# Patient Record
Sex: Male | Born: 1965 | Race: White | Hispanic: No | Smoking: Never smoker
Health system: Southern US, Community
[De-identification: ages and names within clinical notes are randomized; demographics above are authoritative.]

## PROBLEM LIST (undated history)

## (undated) DIAGNOSIS — I10 Essential (primary) hypertension: Secondary | ICD-10-CM

## (undated) DIAGNOSIS — M542 Cervicalgia: Secondary | ICD-10-CM

## (undated) HISTORY — PX: NECK SURGERY: SHX720

## (undated) HISTORY — PX: TONSILLECTOMY: SUR1361

---

## 2007-07-26 ENCOUNTER — Emergency Department (HOSPITAL_COMMUNITY): Admission: EM | Admit: 2007-07-26 | Discharge: 2007-07-26 | Payer: Self-pay | Admitting: Emergency Medicine

## 2009-05-20 ENCOUNTER — Inpatient Hospital Stay (HOSPITAL_COMMUNITY): Admission: RE | Admit: 2009-05-20 | Discharge: 2009-05-21 | Payer: Self-pay | Admitting: Neurosurgery

## 2009-12-29 ENCOUNTER — Encounter
Admission: RE | Admit: 2009-12-29 | Discharge: 2009-12-29 | Payer: Self-pay | Source: Home / Self Care | Attending: Neurosurgery | Admitting: Neurosurgery

## 2010-04-06 LAB — COMPREHENSIVE METABOLIC PANEL
Albumin: 4 g/dL (ref 3.5–5.2)
BUN: 13 mg/dL (ref 6–23)
Calcium: 9.5 mg/dL (ref 8.4–10.5)
Creatinine, Ser: 0.85 mg/dL (ref 0.4–1.5)
Glucose, Bld: 99 mg/dL (ref 70–99)
Total Protein: 6.6 g/dL (ref 6.0–8.3)

## 2010-04-06 LAB — DIFFERENTIAL
Basophils Relative: 1 % (ref 0–1)
Lymphocytes Relative: 21 % (ref 12–46)
Lymphs Abs: 1.4 10*3/uL (ref 0.7–4.0)
Monocytes Relative: 7 % (ref 3–12)
Neutro Abs: 4.6 10*3/uL (ref 1.7–7.7)
Neutrophils Relative %: 67 % (ref 43–77)

## 2010-04-06 LAB — PROTIME-INR: INR: 0.98 (ref 0.00–1.49)

## 2010-04-06 LAB — URINALYSIS, ROUTINE W REFLEX MICROSCOPIC
Bilirubin Urine: NEGATIVE
Nitrite: NEGATIVE
Specific Gravity, Urine: 1.008 (ref 1.005–1.030)
Urobilinogen, UA: 0.2 mg/dL (ref 0.0–1.0)

## 2010-04-06 LAB — SURGICAL PCR SCREEN: MRSA, PCR: NEGATIVE

## 2010-04-06 LAB — CBC
HCT: 44.3 % (ref 39.0–52.0)
Hemoglobin: 15.2 g/dL (ref 13.0–17.0)
MCHC: 34.3 g/dL (ref 30.0–36.0)
Platelets: 278 10*3/uL (ref 150–400)
RDW: 13.5 % (ref 11.5–15.5)

## 2010-04-06 LAB — APTT: aPTT: 34 seconds (ref 24–37)

## 2010-10-14 LAB — D-DIMER, QUANTITATIVE: D-Dimer, Quant: 0.25

## 2010-10-14 LAB — POCT CARDIAC MARKERS
CKMB, poc: <1 — ABNORMAL LOW
Troponin i, poc: <0.05

## 2010-10-14 LAB — POCT I-STAT, CHEM 8
BUN: 12
Calcium, Ion: 1.1 — ABNORMAL LOW
Chloride: 108
Glucose, Bld: 101 — ABNORMAL HIGH

## 2010-10-14 LAB — CBC
HCT: 39.1
Platelets: 274
RDW: 13.4

## 2010-10-14 LAB — DIFFERENTIAL
Basophils Absolute: 0.1
Eosinophils Absolute: 0
Eosinophils Relative: 1
Lymphocytes Relative: 5 — ABNORMAL LOW

## 2015-07-16 ENCOUNTER — Other Ambulatory Visit: Payer: Self-pay | Admitting: Neurosurgery

## 2015-07-16 DIAGNOSIS — M5020 Other cervical disc displacement, unspecified cervical region: Secondary | ICD-10-CM

## 2015-07-27 ENCOUNTER — Ambulatory Visit
Admission: RE | Admit: 2015-07-27 | Discharge: 2015-07-27 | Disposition: A | Discharge status: Home / Self Care | Payer: Worker's Compensation | Source: Ambulatory Visit | Attending: Neurosurgery | Admitting: Neurosurgery

## 2015-07-27 DIAGNOSIS — M5020 Other cervical disc displacement, unspecified cervical region: Secondary | ICD-10-CM

## 2015-07-27 MED ORDER — IOPAMIDOL (ISOVUE-M 300) INJECTION 61%
1.0000 mL | Freq: Once | INTRAMUSCULAR | Status: AC | PRN
  Administered 2015-07-27: 1 mL via EPIDURAL

## 2015-07-27 MED ORDER — TRIAMCINOLONE ACETONIDE 40 MG/ML IJ SUSP (RADIOLOGY)
60.0000 mg | Freq: Once | INTRAMUSCULAR | Status: AC
  Administered 2015-07-27: 60 mg via EPIDURAL

## 2015-07-27 NOTE — Discharge Instructions (Signed)

## 2017-06-26 ENCOUNTER — Emergency Department (HOSPITAL_BASED_OUTPATIENT_CLINIC_OR_DEPARTMENT_OTHER)
Admission: EM | Admit: 2017-06-26 | Discharge: 2017-06-26 | Disposition: A | Discharge status: Home / Self Care | Payer: Medicaid Other | Attending: Emergency Medicine | Admitting: Emergency Medicine

## 2017-06-26 ENCOUNTER — Emergency Department (HOSPITAL_BASED_OUTPATIENT_CLINIC_OR_DEPARTMENT_OTHER): Payer: Medicaid Other

## 2017-06-26 ENCOUNTER — Encounter (HOSPITAL_BASED_OUTPATIENT_CLINIC_OR_DEPARTMENT_OTHER): Payer: Self-pay

## 2017-06-26 ENCOUNTER — Other Ambulatory Visit: Payer: Self-pay

## 2017-06-26 DIAGNOSIS — I10 Essential (primary) hypertension: Secondary | ICD-10-CM | POA: Insufficient documentation

## 2017-06-26 DIAGNOSIS — R079 Chest pain, unspecified: Secondary | ICD-10-CM

## 2017-06-26 DIAGNOSIS — R0789 Other chest pain: Secondary | ICD-10-CM | POA: Insufficient documentation

## 2017-06-26 HISTORY — DX: Essential (primary) hypertension: I10

## 2017-06-26 HISTORY — DX: Cervicalgia: M54.2

## 2017-06-26 LAB — CBC
HEMATOCRIT: 41.3 % (ref 39.0–52.0)
HEMOGLOBIN: 14.3 g/dL (ref 13.0–17.0)
MCH: 31 pg (ref 26.0–34.0)
MCHC: 34.6 g/dL (ref 30.0–36.0)
MCV: 89.4 fL (ref 78.0–100.0)
Platelets: 329 10*3/uL (ref 150–400)
RBC: 4.62 MIL/uL (ref 4.22–5.81)
RDW: 12.5 % (ref 11.5–15.5)
WBC: 8.8 10*3/uL (ref 4.0–10.5)

## 2017-06-26 LAB — HEPATIC FUNCTION PANEL
ALBUMIN: 3.6 g/dL (ref 3.5–5.0)
ALK PHOS: 69 U/L (ref 38–126)
ALT: 22 U/L (ref 17–63)
AST: 23 U/L (ref 15–41)
BILIRUBIN DIRECT: 0.1 mg/dL (ref 0.1–0.5)
BILIRUBIN TOTAL: 0.4 mg/dL (ref 0.3–1.2)
Indirect Bilirubin: 0.3 mg/dL (ref 0.3–0.9)
Total Protein: 7 g/dL (ref 6.5–8.1)

## 2017-06-26 LAB — BASIC METABOLIC PANEL
ANION GAP: 13 (ref 5–15)
BUN: 19 mg/dL (ref 6–20)
CALCIUM: 8.6 mg/dL — AB (ref 8.9–10.3)
CO2: 25 mmol/L (ref 22–32)
Chloride: 100 mmol/L — ABNORMAL LOW (ref 101–111)
Creatinine, Ser: 0.85 mg/dL (ref 0.61–1.24)
GFR calc Af Amer: >60 mL/min (ref >60)
GFR calc non Af Amer: >60 mL/min (ref >60)
GLUCOSE: 108 mg/dL — AB (ref 65–99)
Potassium: 3.3 mmol/L — ABNORMAL LOW (ref 3.5–5.1)
Sodium: 138 mmol/L (ref 135–145)

## 2017-06-26 LAB — TROPONIN I: Troponin I: <0.03 ng/mL (ref <0.03)

## 2017-06-26 LAB — D-DIMER, QUANTITATIVE: D-Dimer, Quant: <0.27 ug/mL-FEU (ref 0.00–0.50)

## 2017-06-26 LAB — LIPASE, BLOOD: Lipase: 37 U/L (ref 11–51)

## 2017-06-26 NOTE — ED Notes (Signed)
ED Provider at bedside. 

## 2017-06-26 NOTE — ED Triage Notes (Signed)
C/o CP x 30 min-NAD-steady gait 

## 2017-06-26 NOTE — Discharge Instructions (Signed)
You were seen in the emergency department for chest pain. Your chest xray was normal. Your ekg and blood work did not show signs of an acute heart attack.   We are unsure of the exact cause of your symptoms, for this reason we would like you to follow up closely with your primary care provider- please call the office tomorrow to make an appointment in 1-3 days for re-evaluation. Return to the ER at any time for new or worsening symptoms including but not limited to worsened pain, trouble breathing, vomiting, fevers, passing out or any other concerns.

## 2017-06-26 NOTE — ED Notes (Signed)
Pt walked to BR with steady gate. Appears to be in NAD>

## 2017-06-26 NOTE — ED Provider Notes (Signed)
[facility] EMERGENCY DEPARTMENT Provider Note   CSN: [id number] Arrival date & time: 06/26/17  1322     History   Chief Complaint Chief Complaint  Patient presents with  . Chest Pain    HPI Terrance Burns is a 52 y.o. male with a hx of HTN who presents to the ED with complaints of chest pain that started approximately 90 minutes ago. He was at the grocery store when pain started. Describes it as left lower chests somewhat radiating to back, somewhat sharp in nature, worse with a deep breath. No other specific alleviating/aggravating factors. Rates it a 5/10 in severity at present. States associated feeling of heart beating quickly. He had hx of similar pain 2 months ago that was self limiting, did not seek evaluation for this and this is the first time it has reoccurred. Denies nausea, vomiting, diarrhea, blood in stool, dyspnea, dizziness, numbness, weakness, or syncope. He states that he drinks coffee in the morning sometimes, had 1 cup this AM, this is not atypical for him. He reports drinking 3 beers per day, last drank last evening, has not had any increase in EtOH recently. Denies drug use. Denies increase in NSAIDs.    HPI  Past Medical History:  Diagnosis Date  . Hypertension   . Neck pain     There are no active problems to display for this patient.   Past Surgical History:  Procedure Laterality Date  . NECK SURGERY    . TONSILLECTOMY          Home Medications    Prior to Admission medications   Not on File    Family History No family history on file.  Social History Social History   Tobacco Use  . Smoking status: Never Smoker  . Smokeless tobacco: Never Used  Substance Use Topics  . Alcohol use: Yes    Comment: daily  . Drug use: Never     Allergies   Codeine; Meperidine; Pseudoephedrine; and Penicillins   Review of Systems Review of Systems  Constitutional: Negative for chills and fever.  Respiratory: Negative for shortness of  breath.   Cardiovascular: Positive for chest pain and palpitations. Negative for leg swelling.  Gastrointestinal: Negative for abdominal pain, blood in stool, constipation, diarrhea, nausea and vomiting.  Neurological: Negative for dizziness, syncope, weakness and numbness.  All other systems reviewed and are negative.   Physical Exam Updated Vital Signs BP (!) 168/108 (BP Location: Left Arm)   Pulse (!) 109   Temp 98.1 F (36.7 C) (Oral)   Resp 18   Ht 5\' 6"  (1.676 m)   Wt 80.3 kg (177 lb)   SpO2 100%   BMI 28.57 kg/m   Physical Exam  Constitutional: He appears well-developed and well-nourished.  Non-toxic appearance. He does not have a sickly appearance. No distress.  HENT:  Head: Normocephalic and atraumatic.  Eyes: Conjunctivae are normal. Right eye exhibits no discharge. Left eye exhibits no discharge.  Neck: Normal range of motion. Neck supple. Carotid bruit is not present.  Cardiovascular: Regular rhythm. Tachycardia present.  No murmur heard. Pulses:      Radial pulses are 2+ on the right side, and 2+ on the left side.       Dorsalis pedis pulses are 2+ on the right side, and 2+ on the left side.  Pulmonary/Chest: Breath sounds normal. No respiratory distress. He has no wheezes. He has no rales.  Abdominal: Soft. He exhibits no distension. There is tenderness (mild epigastric). There  is no rigidity, no rebound, no guarding, no CVA tenderness and negative Murphy's sign.  Musculoskeletal:       Right lower leg: He exhibits no tenderness and no edema.       Left lower leg: He exhibits no tenderness and no edema.  Neurological: He is alert.  Clear speech.   Skin: Skin is warm and dry. No rash noted.  Psychiatric: He has a normal mood and affect. His behavior is normal.  Nursing note and vitals reviewed.   ED Treatments / Results  Labs Results for orders placed or performed during the hospital encounter of 06/26/17  Basic metabolic panel  Result Value Ref Range    Sodium 138 135 - 145 mmol/L   Potassium 3.3 (L) 3.5 - 5.1 mmol/L   Chloride 100 (L) 101 - 111 mmol/L   CO2 25 22 - 32 mmol/L   Glucose, Bld 108 (H) 65 - 99 mg/dL   BUN 19 6 - 20 mg/dL   Creatinine, Ser 4.54 0.61 - 1.24 mg/dL   Calcium 8.6 (L) 8.9 - 10.3 mg/dL   GFR calc non Af Amer >60 >60 mL/min   GFR calc Af Amer >60 >60 mL/min   Anion gap 13 5 - 15  CBC  Result Value Ref Range   WBC 8.8 4.0 - 10.5 K/uL   RBC 4.62 4.22 - 5.81 MIL/uL   Hemoglobin 14.3 13.0 - 17.0 g/dL   HCT 09.8 11.9 - 14.7 %   MCV 89.4 78.0 - 100.0 fL   MCH 31.0 26.0 - 34.0 pg   MCHC 34.6 30.0 - 36.0 g/dL   RDW 82.9 56.2 - 13.0 %   Platelets 329 150 - 400 K/uL  Troponin I  Result Value Ref Range   Troponin I <0.03 <0.03 ng/mL  D-dimer, quantitative (not at [facility])  Result Value Ref Range   D-Dimer, Quant <0.27 0.00 - 0.50 ug/mL-FEU  Lipase, blood  Result Value Ref Range   Lipase 37 11 - 51 U/L  Hepatic function panel  Result Value Ref Range   Total Protein 7.0 6.5 - 8.1 g/dL   Albumin 3.6 3.5 - 5.0 g/dL   AST 23 15 - 41 U/L   ALT 22 17 - 63 U/L   Alkaline Phosphatase 69 38 - 126 U/L   Total Bilirubin 0.4 0.3 - 1.2 mg/dL   Bilirubin, Direct 0.1 0.1 - 0.5 mg/dL   Indirect Bilirubin 0.3 0.3 - 0.9 mg/dL  Troponin I  Result Value Ref Range   Troponin I <0.03 <0.03 ng/mL   EKG EKG Interpretation  Date/Time:  Monday June 26 2017 13:28:46 EDT Ventricular Rate:  102 PR Interval:  152 QRS Duration: 110 QT Interval:  352 QTC Calculation: 458 R Axis:   54 Text Interpretation:  Sinus tachycardia Incomplete right bundle branch block Borderline ECG No significant change since last tracing Confirmed by Marily Memos [phone]) on 06/26/2017 1:57:07 PM   Radiology Dg Chest 2 View  Result Date: 06/26/2017 CLINICAL DATA:  52 year old male with a history of chest pain EXAM: CHEST - 2 VIEW COMPARISON:  05/14/2009 FINDINGS: Cardiomediastinal silhouette within normal limits in size and contour. No evidence of  central vascular congestion. No pneumothorax or pleural effusion. No confluent airspace disease. Similar appearance of rounded density at the peripheral of left lung compatible with granuloma. No acute displaced fracture. Surgical changes of the cervical region incompletely imaged. IMPRESSION: Negative for acute cardiopulmonary disease Electronically Signed   By: Gilmer Mor D.O.   On:  06/26/2017 13:44    Procedures Procedures (including critical care time)  Medications Ordered in ED Medications - No data to display   Initial Impression / Assessment and Plan / ED Course  I have reviewed the triage vital signs and the nursing notes.  Pertinent labs & imaging results that were available during my care of the patient were reviewed by me and considered in my medical decision making (see chart for details).   Patient presents with complaint of chest pain. Patient nontoxic appearing, in no apparent distress, vitals notable for hypertension and tachycardia initially. Patient's exam otherwise notable for very mild epigastric tenderness, no peritoneal signs. DDX: ACS, PE, dissection, gastritis/GERD, cholecystitis, pancreatitis. Will evaluate with labs, CXR, and EKG. Patient declining pain medication.   Labs fairly unremarkable. No leukocytosis. No anemia. Mild electrolyte abnormalities including hypokalemia at 3.3, hypochloremia at 100, and hypocalcemia at 8.6. Lipase, LFTs, and renal function WNL. Troponin negative x 2. D-dimer WNL. CXR negative for pneumonia, effusion, or pneumothorax.   Patient with low risk Heart Score, negative troponin x 2, EKG without obvious ischemia, doubt ACS. Patient low risk wells- ddimer negative, doubt pulmonary embolism. Patient's pain is not a tearing sensation, improved without intervention, 2+ symmetric pulses, no widening of the mediastinum on imagining, doubt dissection. Patient does not have a surgical abdomen, no peritoneal signs, in combination with labs doubt  pancreatitis or cholecystitis.   17:30: RE-EVAL: Patient feeling much better, states he is ready to go home. Vitals improved, remains somewhat hypertensive, aware of need for recheck.   Unclear definitive etiology to patient's symptoms. Will discharge home with recommendations for close PCP follow up and strict return precautions. I discussed results, treatment plan, need for PCP follow-up, and return precautions with the patient. Provided opportunity for questions, patient confirmed understanding and is in agreement with plan.   Findings and plan of care discussed with supervising physician Dr. Clayborne Dana who is in agreement with plan.   Final Clinical Impressions(s) / ED Diagnoses   Final diagnoses:  Chest pain, unspecified type    ED Discharge Orders    None       Cherly Anderson, PA-C 06/26/17 1931    Mesner, Barbara Cower, MD 06/27/17 [phone]

## 2017-06-28 ENCOUNTER — Other Ambulatory Visit: Payer: Self-pay | Admitting: Nurse Practitioner

## 2017-06-28 DIAGNOSIS — M5020 Other cervical disc displacement, unspecified cervical region: Secondary | ICD-10-CM

## 2017-06-29 ENCOUNTER — Ambulatory Visit
Admission: RE | Admit: 2017-06-29 | Discharge: 2017-06-29 | Disposition: A | Discharge status: Home / Self Care | Payer: Medicaid Other | Source: Ambulatory Visit | Attending: Nurse Practitioner | Admitting: Nurse Practitioner

## 2017-06-29 DIAGNOSIS — M5020 Other cervical disc displacement, unspecified cervical region: Secondary | ICD-10-CM

## 2018-09-27 ENCOUNTER — Other Ambulatory Visit: Payer: Self-pay | Admitting: Neurosurgery

## 2018-09-27 DIAGNOSIS — M47812 Spondylosis without myelopathy or radiculopathy, cervical region: Secondary | ICD-10-CM

## 2018-09-28 ENCOUNTER — Other Ambulatory Visit: Payer: Self-pay | Admitting: Neurosurgery

## 2018-09-28 ENCOUNTER — Telehealth: Payer: Self-pay

## 2018-09-28 DIAGNOSIS — M47812 Spondylosis without myelopathy or radiculopathy, cervical region: Secondary | ICD-10-CM

## 2018-09-28 NOTE — Telephone Encounter (Signed)
Phone call to patient to verify medication list and allergies for myelogram procedure. Pt instructed that his current medications were safe to continue to take. Advised to call [facility] imaging if his medications were to change. Pt also instructed to lay flat 24 hours post op and to have a driver the day of the procedure. Pt verbalized understanding.

## 2018-10-03 ENCOUNTER — Other Ambulatory Visit: Payer: Medicaid Other

## 2018-10-04 ENCOUNTER — Other Ambulatory Visit: Payer: Medicaid Other

## 2018-10-04 ENCOUNTER — Inpatient Hospital Stay: Admission: RE | Admit: 2018-10-04 | Payer: Medicaid Other | Source: Ambulatory Visit

## 2018-11-05 ENCOUNTER — Ambulatory Visit
Admission: RE | Admit: 2018-11-05 | Discharge: 2018-11-05 | Disposition: A | Discharge status: Home / Self Care | Payer: Medicaid Other | Source: Ambulatory Visit | Attending: Neurosurgery | Admitting: Neurosurgery

## 2018-11-05 ENCOUNTER — Other Ambulatory Visit: Payer: Self-pay

## 2018-11-05 DIAGNOSIS — M47812 Spondylosis without myelopathy or radiculopathy, cervical region: Secondary | ICD-10-CM

## 2018-11-05 MED ORDER — IOPAMIDOL (ISOVUE-M 300) INJECTION 61%
10.0000 mL | Freq: Once | INTRAMUSCULAR | Status: AC | PRN
  Administered 2018-11-05: 10 mL via INTRATHECAL

## 2018-11-05 MED ORDER — DIAZEPAM 5 MG PO TABS
10.0000 mg | ORAL_TABLET | Freq: Once | ORAL | Status: AC
  Administered 2018-11-05: 13:00:00 10 mg via ORAL

## 2018-11-05 NOTE — Discharge Instructions (Signed)

## 2020-02-14 ENCOUNTER — Other Ambulatory Visit: Payer: Self-pay

## 2020-02-14 ENCOUNTER — Encounter (HOSPITAL_BASED_OUTPATIENT_CLINIC_OR_DEPARTMENT_OTHER): Payer: Self-pay | Admitting: *Deleted

## 2020-02-14 ENCOUNTER — Other Ambulatory Visit (HOSPITAL_BASED_OUTPATIENT_CLINIC_OR_DEPARTMENT_OTHER): Payer: Self-pay | Admitting: Emergency Medicine

## 2020-02-14 ENCOUNTER — Emergency Department (HOSPITAL_BASED_OUTPATIENT_CLINIC_OR_DEPARTMENT_OTHER)
Admission: EM | Admit: 2020-02-14 | Discharge: 2020-02-14 | Disposition: A | Discharge status: Home / Self Care | Payer: Medicaid Other | Attending: Emergency Medicine | Admitting: Emergency Medicine

## 2020-02-14 DIAGNOSIS — Z7982 Long term (current) use of aspirin: Secondary | ICD-10-CM | POA: Insufficient documentation

## 2020-02-14 DIAGNOSIS — I10 Essential (primary) hypertension: Secondary | ICD-10-CM | POA: Diagnosis not present

## 2020-02-14 DIAGNOSIS — Z20822 Contact with and (suspected) exposure to covid-19: Secondary | ICD-10-CM

## 2020-02-14 DIAGNOSIS — R519 Headache, unspecified: Secondary | ICD-10-CM | POA: Diagnosis present

## 2020-02-14 DIAGNOSIS — U071 COVID-19: Secondary | ICD-10-CM | POA: Diagnosis not present

## 2020-02-14 DIAGNOSIS — Z79899 Other long term (current) drug therapy: Secondary | ICD-10-CM | POA: Insufficient documentation

## 2020-02-14 MED ORDER — AMLODIPINE BESYLATE 5 MG PO TABS: 5.0000 mg | ORAL_TABLET | Freq: Every day | ORAL | 0 refills | Status: AC

## 2020-02-14 NOTE — Discharge Instructions (Signed)
If you had any testing done today, the results will show up on your MyChart phone app in 24 hours.  Call your primary care doctor in the next 1-2 days to arrange video follow-up.   Use a finger pulse oximeter at home.  You may purchase one at [facility] or [facility] or online.  If the numbers drops and stays below 90%, return immediately back to the ER.  Otherwise increase your fluid intake, isolate at home for 5 days after symptoms resolve, and inform recent close contacts of the need to test for Covid.  

## 2020-02-14 NOTE — ED Provider Notes (Signed)
[facility] EMERGENCY DEPARTMENT Provider Note   CSN: [id number] Arrival date & time: 02/14/20  1428     History Chief Complaint  Patient presents with  . Covid Exposure    Terrance Burns is a 55 y.o. male.  Patient presents for concern for Covid exposure.  He states that his child at home tested positive for Covid last week.  Patient subsequently had about 6 days of generalized body aches chills headaches nausea decreased appetite.  Denies cough or fevers.  Denies diarrhea.  Patient was vaccinated with the [facility] vaccine several months ago.        Past Medical History:  Diagnosis Date  . Hypertension   . Neck pain     There are no problems to display for this patient.   Past Surgical History:  Procedure Laterality Date  . NECK SURGERY    . TONSILLECTOMY         History reviewed. No pertinent family history.  Social History   Tobacco Use  . Smoking status: Never Smoker  . Smokeless tobacco: Never Used  Substance Use Topics  . Alcohol use: Yes    Comment: daily  . Drug use: Never    Home Medications Prior to Admission medications   Medication Sig Start Date End Date Taking? Authorizing Provider  amLODipine (NORVASC) 5 MG tablet Take 1 tablet (5 mg total) by mouth daily. 02/14/20  Yes Cheryll Cockayne, MD  aspirin 325 MG tablet Take 325 mg by mouth daily. Pt reports he only takes half of a 325mg .    [provider]  famotidine (PEPCID) 20 MG tablet Take 20 mg by mouth 2 (two) times daily.    [provider]  fluticasone (FLONASE) 50 MCG/ACT nasal spray Place 1 spray into both nostrils daily.    [provider]  losartan-hydrochlorothiazide (HYZAAR) 100-25 MG tablet Take 1 tablet by mouth daily.    [provider]  omeprazole (PRILOSEC) 40 MG capsule Take 40 mg by mouth daily.    [provider]  simvastatin (ZOCOR) 40 MG tablet Take 40 mg by mouth daily.    [provider]     Allergies    Codeine, Meperidine, Pseudoephedrine, and Penicillins  Review of Systems   Review of Systems  Constitutional: Negative for fever.  HENT: Negative for ear pain and sore throat.   Eyes: Negative for pain.  Respiratory: Negative for cough.   Cardiovascular: Negative for chest pain.  Gastrointestinal: Negative for abdominal pain.  Genitourinary: Negative for flank pain.  Musculoskeletal: Negative for back pain.  Skin: Negative for color change and rash.  Neurological: Negative for syncope.  All other systems reviewed and are negative.   Physical Exam Updated Vital Signs BP (!) 172/105 (BP Location: Left Arm)   Pulse 89   Temp 98.4 F (36.9 C) (Oral)   Resp 20   Ht 5\' 6"  (1.676 m)   Wt 81.6 kg   SpO2 99%   BMI 29.05 kg/m   Physical Exam Constitutional:      General: He is not in acute distress.    Appearance: He is well-developed.  HENT:     Head: Normocephalic.     Nose: Nose normal.  Eyes:     Extraocular Movements: Extraocular movements intact.  Cardiovascular:     Rate and Rhythm: Normal rate.  Pulmonary:     Effort: Pulmonary effort is normal.  Skin:    Coloration: Skin is not jaundiced.  Neurological:  Mental Status: He is alert. Mental status is at baseline.     ED Results / Procedures / Treatments   Labs (all labs ordered are listed, but only abnormal results are displayed) Labs Reviewed  SARS CORONAVIRUS 2 (TAT 6-24 HRS)    EKG None  Radiology No results found.  Procedures Procedures   Medications Ordered in ED Medications - No data to display  ED Course  I have reviewed the triage vital signs and the nursing notes.  Pertinent labs & imaging results that were available during my care of the patient were reviewed by me and considered in my medical decision making (see chart for details).    MDM Rules/Calculators/A&P                          Covid test sent here.  Patient persistently hypertensive here in the 170  systolic.  He states that he took his blood pressure medicine slightly later than normal today.  Given prescription of Norvasc to take home.  Advise close follow-up with his primary care doctor via phone within the next 3 to 4 days.    Recommending isolating at home while awaiting test results, use of finger pulse oximeter use at home, advised immediate return if the number is persistently below 90%, if you have  worsening symptoms, trouble breathing or any additional concerns to return immediately back to the ER.  Otherwise advised follow-up with primary care doctor in 2 or 3 days.   Final Clinical Impression(s) / ED Diagnoses Final diagnoses:  Suspected COVID-19 virus infection  Hypertension, unspecified type    Rx / DC Orders ED Discharge Orders         Ordered    amLODipine (NORVASC) 5 MG tablet  Daily        02/14/20 1640           Cheryll Cockayne, MD 02/14/20 1640

## 2020-02-14 NOTE — ED Triage Notes (Signed)
covid exposure with sx x 6 days, fatigue , bodyaches

## 2020-02-15 LAB — SARS CORONAVIRUS 2 (TAT 6-24 HRS): SARS Coronavirus 2: POSITIVE — AB

## 2020-11-27 ENCOUNTER — Other Ambulatory Visit: Payer: Self-pay

## 2020-11-27 ENCOUNTER — Emergency Department (HOSPITAL_BASED_OUTPATIENT_CLINIC_OR_DEPARTMENT_OTHER): Payer: Medicare Other

## 2020-11-27 ENCOUNTER — Emergency Department (HOSPITAL_BASED_OUTPATIENT_CLINIC_OR_DEPARTMENT_OTHER)
Admission: EM | Admit: 2020-11-27 | Discharge: 2020-11-27 | Disposition: A | Discharge status: Home / Self Care | Payer: Medicare Other | Attending: Emergency Medicine | Admitting: Emergency Medicine

## 2020-11-27 ENCOUNTER — Encounter (HOSPITAL_BASED_OUTPATIENT_CLINIC_OR_DEPARTMENT_OTHER): Payer: Self-pay | Admitting: Emergency Medicine

## 2020-11-27 DIAGNOSIS — R0789 Other chest pain: Secondary | ICD-10-CM | POA: Insufficient documentation

## 2020-11-27 DIAGNOSIS — R002 Palpitations: Secondary | ICD-10-CM | POA: Diagnosis not present

## 2020-11-27 DIAGNOSIS — I10 Essential (primary) hypertension: Secondary | ICD-10-CM | POA: Insufficient documentation

## 2020-11-27 DIAGNOSIS — Z7982 Long term (current) use of aspirin: Secondary | ICD-10-CM | POA: Insufficient documentation

## 2020-11-27 DIAGNOSIS — Z79899 Other long term (current) drug therapy: Secondary | ICD-10-CM | POA: Insufficient documentation

## 2020-11-27 LAB — TROPONIN I (HIGH SENSITIVITY)
Troponin I (High Sensitivity): 2 ng/L (ref <18)
Troponin I (High Sensitivity): 2 ng/L (ref <18)

## 2020-11-27 LAB — MAGNESIUM: Magnesium: 2.1 mg/dL (ref 1.7–2.4)

## 2020-11-27 LAB — BASIC METABOLIC PANEL
Anion gap: 12 (ref 5–15)
BUN: 16 mg/dL (ref 6–20)
CO2: 26 mmol/L (ref 22–32)
Calcium: 9.2 mg/dL (ref 8.9–10.3)
Chloride: 93 mmol/L — ABNORMAL LOW (ref 98–111)
Creatinine, Ser: 0.78 mg/dL (ref 0.61–1.24)
GFR, Estimated: >60 mL/min (ref >60)
Glucose, Bld: 121 mg/dL — ABNORMAL HIGH (ref 70–99)
Potassium: 3.5 mmol/L (ref 3.5–5.1)
Sodium: 131 mmol/L — ABNORMAL LOW (ref 135–145)

## 2020-11-27 LAB — CBC WITH DIFFERENTIAL/PLATELET
Abs Immature Granulocytes: 0.07 10*3/uL (ref 0.00–0.07)
Basophils Absolute: 0.1 10*3/uL (ref 0.0–0.1)
Basophils Relative: 1 %
Eosinophils Absolute: 0.1 10*3/uL (ref 0.0–0.5)
Eosinophils Relative: 1 %
HCT: 41.5 % (ref 39.0–52.0)
Hemoglobin: 14.3 g/dL (ref 13.0–17.0)
Immature Granulocytes: 1 %
Lymphocytes Relative: 22 %
Lymphs Abs: 2 10*3/uL (ref 0.7–4.0)
MCH: 30.3 pg (ref 26.0–34.0)
MCHC: 34.5 g/dL (ref 30.0–36.0)
MCV: 87.9 fL (ref 80.0–100.0)
Monocytes Absolute: 0.7 10*3/uL (ref 0.1–1.0)
Monocytes Relative: 8 %
Neutro Abs: 6.4 10*3/uL (ref 1.7–7.7)
Neutrophils Relative %: 67 %
Platelets: 460 10*3/uL — ABNORMAL HIGH (ref 150–400)
RBC: 4.72 MIL/uL (ref 4.22–5.81)
RDW: 12.3 % (ref 11.5–15.5)
WBC: 9.3 10*3/uL (ref 4.0–10.5)
nRBC: 0 % (ref 0.0–0.2)

## 2020-11-27 LAB — TSH: TSH: 1.794 u[IU]/mL (ref 0.350–4.500)

## 2020-11-27 NOTE — ED Provider Notes (Signed)
[facility] EMERGENCY DEPARTMENT Provider Note   CSN: [id number] Arrival date & time: 11/27/20  8242     History Chief Complaint  Patient presents with   Tachycardia    Terrance Burns is a 55 y.o. male.  Presents for sensation of heart racing, palpitations.  States that this morning he had sudden onset of feeling like his heart was racing, some slight chest discomfort initially.  No ongoing pain at present.  Feels like it was in the lower part of his chest or upper abdomen.  Denies any abdominal pain at present.  No nausea or vomiting.  He denies any known cardiac issues, no prior cardiac arrhythmias.  HPI     Past Medical History:  Diagnosis Date   Hypertension    Neck pain     There are no problems to display for this patient.   Past Surgical History:  Procedure Laterality Date   NECK SURGERY     TONSILLECTOMY         No family history on file.  Social History   Tobacco Use   Smoking status: Never   Smokeless tobacco: Never  Substance Use Topics   Alcohol use: Yes    Comment: daily   Drug use: Never    Home Medications Prior to Admission medications   Medication Sig Start Date End Date Taking? Authorizing Provider  amLODipine (NORVASC) 5 MG tablet Take 1 tablet (5 mg total) by mouth daily. 02/14/20   Cheryll Cockayne, MD  amLODipine (NORVASC) 5 MG tablet TAKE 1 TABLET BY MOUTH ONCE DAILY 02/14/20 02/13/21  Cheryll Cockayne, MD  aspirin 325 MG tablet Take 325 mg by mouth daily. Pt reports he only takes half of a 325mg .    [provider]  famotidine (PEPCID) 20 MG tablet Take 20 mg by mouth 2 (two) times daily.    [provider]  fluticasone (FLONASE) 50 MCG/ACT nasal spray Place 1 spray into both nostrils daily.    [provider]  losartan-hydrochlorothiazide (HYZAAR) 100-25 MG tablet Take 1 tablet by mouth daily.    [provider]  omeprazole (PRILOSEC) 40 MG capsule Take 40 mg by mouth daily.    [provider]  simvastatin (ZOCOR) 40 MG tablet Take 40 mg by mouth daily.    [provider]    Allergies    Codeine, Meperidine, Pseudoephedrine, and Penicillins  Review of Systems   Review of Systems  Constitutional:  Negative for chills and fever.  HENT:  Negative for ear pain and sore throat.   Eyes:  Negative for pain and visual disturbance.  Respiratory:  Negative for cough and shortness of breath.   Cardiovascular:  Positive for chest pain and palpitations.  Gastrointestinal:  Negative for abdominal pain and vomiting.  Genitourinary:  Negative for dysuria and hematuria.  Musculoskeletal:  Negative for arthralgias and back pain.  Skin:  Negative for color change and rash.  Neurological:  Negative for seizures and syncope.  All other systems reviewed and are negative.  Physical Exam Updated Vital Signs BP (!) 142/91   Pulse 89   Temp 98.4 F (36.9 C) (Oral)   Resp 12   Ht 5\' 6"  (1.676 m)   Wt 81.6 kg   SpO2 100%   BMI 29.05 kg/m   Physical Exam Vitals and nursing note reviewed.  Constitutional:      Appearance: He is well-developed.  HENT:     Head: Normocephalic and atraumatic.  Eyes:  Conjunctiva/sclera: Conjunctivae normal.  Cardiovascular:     Rate and Rhythm: Normal rate and regular rhythm.     Heart sounds: No murmur heard. Pulmonary:     Effort: Pulmonary effort is normal. No respiratory distress.     Breath sounds: Normal breath sounds.  Abdominal:     Palpations: Abdomen is soft.     Tenderness: There is no abdominal tenderness.  Musculoskeletal:        General: No deformity or signs of injury.     Cervical back: Neck supple.  Skin:    General: Skin is warm and dry.  Neurological:     Mental Status: He is alert.    ED Results / Procedures / Treatments   Labs (all labs ordered are listed, but only abnormal results are displayed) Labs Reviewed  CBC WITH DIFFERENTIAL/PLATELET - Abnormal; Notable for the following components:       Result Value   Platelets 460 (*)    All other components within normal limits  BASIC METABOLIC PANEL - Abnormal; Notable for the following components:   Sodium 131 (*)    Chloride 93 (*)    Glucose, Bld 121 (*)    All other components within normal limits  MAGNESIUM  TSH  TROPONIN I (HIGH SENSITIVITY)  TROPONIN I (HIGH SENSITIVITY)    EKG EKG Interpretation  Date/Time:  Friday November 27 2020 09:29:05 EST Ventricular Rate:  104 PR Interval:  196 QRS Duration: 112 QT Interval:  357 QTC Calculation: 470 R Axis:   59 Text Interpretation: Sinus tachycardia Incomplete right bundle branch block Confirmed by Marianna Fuss ([id number]) on 11/27/2020 9:31:53 AM  Radiology DG Chest 2 View  Result Date: 11/27/2020 CLINICAL DATA:  Palpitations EXAM: CHEST - 2 VIEW COMPARISON:  Chest radiograph 06/26/2017 FINDINGS: Unchanged cardiomediastinal silhouette. There is no focal airspace consolidation. There is no large pleural effusion or visible pneumothorax. There is a nodular opacity overlying the left peripheral mid lung unchanged from prior exam, consistent with a calcified granuloma. No acute osseous abnormality. IMPRESSION: No evidence of acute cardiopulmonary disease. Electronically Signed   By: Caprice Renshaw M.D.   On: 11/27/2020 11:57    Procedures Procedures   Medications Ordered in ED Medications - No data to display  ED Course  I have reviewed the triage vital signs and the nursing notes.  Pertinent labs & imaging results that were available during my care of the patient were reviewed by me and considered in my medical decision making (see chart for details).    MDM Rules/Calculators/A&P                           54 year old gentleman presented to ER with sensation of palpitations, chest discomfort.  On physical exam he appears well in no acute distress, initial triage vitals noted for slight tachycardia but otherwise stable vital signs.  Without intervention, patient's heart  rate normalized.  EKG demonstrated sinus rhythm.  Troponin within normal limits, doubt ACS.  CXR negative for acute cardiopulmonary process.  Remainder of labs noted for mild hyponatremia.  No recent prior for comparison.  Low suspicion that this mild degree of hyponatremia was contributing to symptoms today.  On reassessment and discussing patient's results, he has no ongoing complaint and remains in normal sinus rhythm.  Given his well appearance and reassuring work-up today, believe he is stable for discharge.  Recommended patient follow-up with primary care doctor to discuss these symptoms and follow-up on TSH results  with primary care doctor. TSH pending at time of dc.   After the discussed management above, the patient was determined to be safe for discharge.  The patient was in agreement with this plan and all questions regarding their care were answered.  ED return precautions were discussed and the patient will return to the ED with any significant worsening of condition.   Final Clinical Impression(s) / ED Diagnoses Final diagnoses:  Palpitations    Rx / DC Orders ED Discharge Orders     None        Lucrezia Starch, MD 11/28/20 1209

## 2020-11-27 NOTE — Discharge Instructions (Addendum)
Follow-up with your primary care doctor.  Please discuss your mildly low sodium and discussed the results of your TSH.  Check MyChart for TSH results.  This helps understand your thyroid function.    If you have any episodes of chest pain, difficulty breathing or other new concerning symptom, come back to ER for reassessment.

## 2020-11-27 NOTE — ED Triage Notes (Signed)
Reports sudden onset of feeling like his heart was racing.  Had some epigastric pain initially.  Denies any pain currently. denies any n/v or SOB.

## 2021-04-06 ENCOUNTER — Emergency Department (HOSPITAL_BASED_OUTPATIENT_CLINIC_OR_DEPARTMENT_OTHER): Payer: Medicare Other

## 2021-04-06 ENCOUNTER — Other Ambulatory Visit: Payer: Self-pay

## 2021-04-06 ENCOUNTER — Encounter (HOSPITAL_BASED_OUTPATIENT_CLINIC_OR_DEPARTMENT_OTHER): Payer: Self-pay | Admitting: *Deleted

## 2021-04-06 ENCOUNTER — Emergency Department (HOSPITAL_BASED_OUTPATIENT_CLINIC_OR_DEPARTMENT_OTHER)
Admission: EM | Admit: 2021-04-06 | Discharge: 2021-04-06 | Disposition: A | Discharge status: Home / Self Care | Payer: Medicare Other | Attending: Emergency Medicine | Admitting: Emergency Medicine

## 2021-04-06 DIAGNOSIS — R051 Acute cough: Secondary | ICD-10-CM

## 2021-04-06 DIAGNOSIS — Z7982 Long term (current) use of aspirin: Secondary | ICD-10-CM | POA: Insufficient documentation

## 2021-04-06 DIAGNOSIS — Z79899 Other long term (current) drug therapy: Secondary | ICD-10-CM | POA: Insufficient documentation

## 2021-04-06 DIAGNOSIS — J029 Acute pharyngitis, unspecified: Secondary | ICD-10-CM | POA: Diagnosis not present

## 2021-04-06 DIAGNOSIS — R059 Cough, unspecified: Secondary | ICD-10-CM | POA: Diagnosis present

## 2021-04-06 DIAGNOSIS — Z20822 Contact with and (suspected) exposure to covid-19: Secondary | ICD-10-CM | POA: Diagnosis not present

## 2021-04-06 LAB — RESP PANEL BY RT-PCR (FLU A&B, COVID) ARPGX2
Influenza A by PCR: NEGATIVE
Influenza B by PCR: NEGATIVE
SARS Coronavirus 2 by RT PCR: NEGATIVE

## 2021-04-06 LAB — GROUP A STREP BY PCR: Group A Strep by PCR: NOT DETECTED

## 2021-04-06 MED ORDER — SUCRALFATE 1 G PO TABS: 1.0000 g | ORAL_TABLET | Freq: Three times a day (TID) | ORAL | 0 refills | Status: AC

## 2021-04-06 MED ORDER — DEXAMETHASONE 4 MG PO TABS
10.0000 mg | ORAL_TABLET | Freq: Once | ORAL | Status: AC
  Administered 2021-04-06: 10 mg via ORAL
  Filled 2021-04-06: qty 3

## 2021-04-06 NOTE — Discharge Instructions (Signed)
COVID, strep test are negative.  Chest x-ray negative for infection.  I have treated you with a long-acting steroid in case this is allergy related.  This could be acid reflux related as well.  Have called you and Carafate. ?

## 2021-04-06 NOTE — ED Triage Notes (Signed)
Cough and sore throat x 2 days. Today he has pain in his sternum into his back. EKG at triage. ?

## 2021-04-06 NOTE — ED Provider Notes (Signed)
?[facility] EMERGENCY DEPARTMENT ?Provider Note ? ? ?CSN: [id number] ?Arrival date & time: 04/06/21  1843 ? ?  ? ?History ? ?Chief Complaint  ?Patient presents with  ? Cough  ? Sore Throat  ? ? ?Terrance Burns is a 56 y.o. male. ? ?Patient here with cough and sore throat for the last several days.  When Terrance Burns coughs Terrance Burns is having some discomfort in his upper chest and back.  Denies any shortness of breath, sputum production, fever, chills.  History of acid reflux and allergies.  Also history of high blood pressure.  Denies any active chest pain, shortness of breath.  Discomfort mostly when Terrance Burns coughs.  Denies any sick contacts. ? ?The history is provided by the patient.  ? ?  ? ?Home Medications ?Prior to Admission medications   ?Medication Sig Start Date End Date Taking? Authorizing Provider  ?amLODipine (NORVASC) 5 MG tablet Take 1 tablet (5 mg total) by mouth daily. 02/14/20  Yes Cheryll Cockayne, MD  ?aspirin 325 MG tablet Take 325 mg by mouth daily. Pt reports Terrance Burns only takes half of a 325mg .   Yes [provider]  ?famotidine (PEPCID) 20 MG tablet Take 20 mg by mouth 2 (two) times daily.   Yes [provider]  ?fluticasone (FLONASE) 50 MCG/ACT nasal spray Place 1 spray into both nostrils daily.   Yes [provider]  ?losartan-hydrochlorothiazide (HYZAAR) 100-25 MG tablet Take 1 tablet by mouth daily.   Yes [provider]  ?omeprazole (PRILOSEC) 40 MG capsule Take 40 mg by mouth daily.   Yes [provider]  ?simvastatin (ZOCOR) 40 MG tablet Take 40 mg by mouth daily.   Yes [provider]  ?sucralfate (CARAFATE) 1 g tablet Take 1 tablet (1 g total) by mouth 4 (four) times daily -  with meals and at bedtime for 14 days. 04/06/21 04/20/21 Yes Cameren Odwyer, DO  ?amLODipine (NORVASC) 5 MG tablet TAKE 1 TABLET BY MOUTH ONCE DAILY 02/14/20 02/13/21  02/15/21, MD  ?   ? ?Allergies    ?Codeine, Meperidine, Pseudoephedrine, and Penicillins   ? ?Review of Systems    ?Review of Systems ? ?Physical Exam ?Updated Vital Signs ?BP (!) 161/119 (BP Location: Right Arm)   Pulse 92   Temp 98.9 ?F (37.2 ?C)   Resp 20   Ht 5\' 6"  (1.676 m)   Wt 81.6 kg   SpO2 100%   BMI 29.05 kg/m?  ?Physical Exam ?Vitals and nursing note reviewed.  ?Constitutional:   ?   General: Terrance Burns is not in acute distress. ?   Appearance: Terrance Burns is well-developed. Terrance Burns is not ill-appearing.  ?HENT:  ?   Head: Normocephalic and atraumatic.  ?   Nose: Nose normal.  ?   Mouth/Throat:  ?   Mouth: Mucous membranes are moist.  ?   Pharynx: Posterior oropharyngeal erythema present.  ?Eyes:  ?   Extraocular Movements: Extraocular movements intact.  ?   Conjunctiva/sclera: Conjunctivae normal.  ?   Pupils: Pupils are equal, round, and reactive to light.  ?Cardiovascular:  ?   Rate and Rhythm: Normal rate and regular rhythm.  ?   Pulses: Normal pulses.     ?     Radial pulses are 2+ on the right side and 2+ on the left side.  ?   Heart sounds: No murmur heard. ?Pulmonary:  ?   Effort: Pulmonary effort is normal. No respiratory distress.  ?   Breath sounds: Normal breath sounds.  ?  Chest:  ?   Chest wall: No mass.  ?Abdominal:  ?   Palpations: Abdomen is soft.  ?   Tenderness: There is no abdominal tenderness.  ?Musculoskeletal:     ?   General: No swelling.  ?   Cervical back: Normal range of motion and neck supple.  ?Skin: ?   General: Skin is warm and dry.  ?   Capillary Refill: Capillary refill takes less than 2 seconds.  ?Neurological:  ?   Mental Status: Terrance Burns is alert.  ?Psychiatric:     ?   Mood and Affect: Mood normal.  ? ? ?ED Results / Procedures / Treatments   ?Labs ?(all labs ordered are listed, but only abnormal results are displayed) ?Labs Reviewed  ?RESP PANEL BY RT-PCR (FLU A&B, COVID) [id number]  ?GROUP A STREP BY PCR  ? ? ?EKG ?EKG Interpretation ? ?Date/Time:  Tuesday April 06 2021 18:51:58 EDT ?Ventricular Rate:  87 ?PR Interval:  166 ?QRS Duration: 108 ?QT Interval:  376 ?QTC Calculation: 452 ?R Axis:   57 ?Text  Interpretation: Normal sinus rhythm Incomplete right bundle branch block When compared with ECG of 27-Nov-2020 09:29, PREVIOUS ECG IS PRESENT Confirmed by Virgina Norfolkuratolo, Ellisa Devivo [phone]) on 04/06/2021 6:59:01 PM ? ?Radiology ?DG Chest Portable 1 View ? ?Result Date: 04/06/2021 ?CLINICAL DATA:  Chest pain with cough. EXAM: PORTABLE CHEST 1 VIEW COMPARISON:  Chest x-ray 11/27/2020 FINDINGS: The heart size and mediastinal contours are within normal limits. There is no focal lung infiltrate. There is a stable calcified granuloma in the left upper lobe. The visualized skeletal structures are unremarkable. IMPRESSION: No active disease. Electronically Signed   By: Darliss CheneyAmy  Guttmann M.D.   On: 04/06/2021 19:13   ? ?Procedures ?Procedures  ? ? ?Medications Ordered in ED ?Medications  ?dexamethasone (DECADRON) tablet 10 mg (10 mg Oral Given 04/06/21 1947)  ? ? ?ED Course/ Medical Decision Making/ A&P ?  ?                        ?Medical Decision Making ?Amount and/or Complexity of Data Reviewed ?Radiology: ordered. ? ?Risk ?Prescription drug management. ? ? ?Terrance DestineRobert Burns is here with cough and sore throat.  Unremarkable vitals.  No fever.  Erythema to the back of throat.  No signs of abscess.  Clear breath sounds.  Symptoms for the last several days.  Chest pain when Terrance Burns coughs.  Denies any active chest pain, shortness of breath, sputum production.  EKG done in triage per my review and interpretation shows sinus rhythm.  No ischemic changes.  Doubt acute coronary syndrome or pulmonary embolism.  Differential diagnosis is that this is likely viral, strep throat, bronchitis, pneumonia.  We will get chest x-ray, COVID, flu test. ? ?COVID, flu, strep test negative.  Chest x-ray per my review and interpretation shows no obvious pneumonia.  Patient treated with Decadron.  Could be acid reflux symptoms and will add Carafate to go with his PPI.  Discharged in good condition.  Understands return precautions. ? ?This chart was dictated using voice  recognition software.  Despite best efforts to proofread,  errors can occur which can change the documentation meaning.  ? ? ? ? ? ? ? ?Final Clinical Impression(s) / ED Diagnoses ?Final diagnoses:  ?Sore throat  ?Acute cough  ? ? ?Rx / DC Orders ?ED Discharge Orders   ? ?      Ordered  ?  sucralfate (CARAFATE) 1 g tablet  3 times daily with  meals & bedtime       ? 04/06/21 1949  ? ?  ?  ? ?  ? ? ?  ?Virgina Norfolk, DO ?04/06/21 1953 ? ?

## 2021-07-03 IMAGING — CT CT CERVICAL SPINE W/ CM
2 series · 10 of 14 positions shown, 12 images · non-contrast
Comparison: none

CLINICAL DATA: Paresthesias into the right hand involving the
middle and ring finger. Left-sided shoulder pain. Previous
discectomy and placement of prosthetic disc at the C3-4 level.
TECHNIQUE: Contiguous axial images were obtained through the cervical spine
after the intrathecal infusion of contrast. Coronal and sagittal
reconstructions were obtained of the axial image sets.

[Series 3: cspine soft (person_name) · axial · 0.30mm/px · z∈[-237,-113]mm · 5 of 94 slices shown]
[im 16/94  soft-tissue]
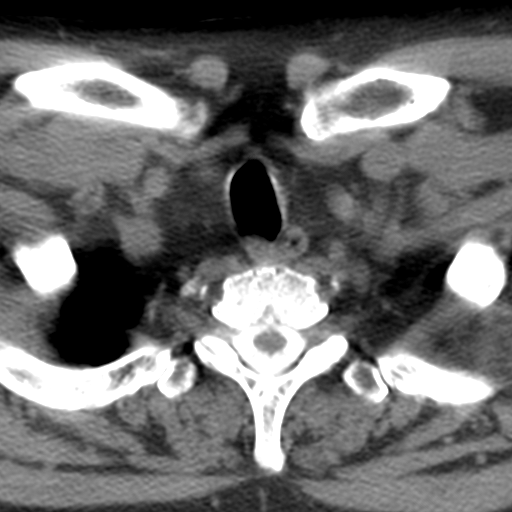
[im 32/94  soft-tissue]
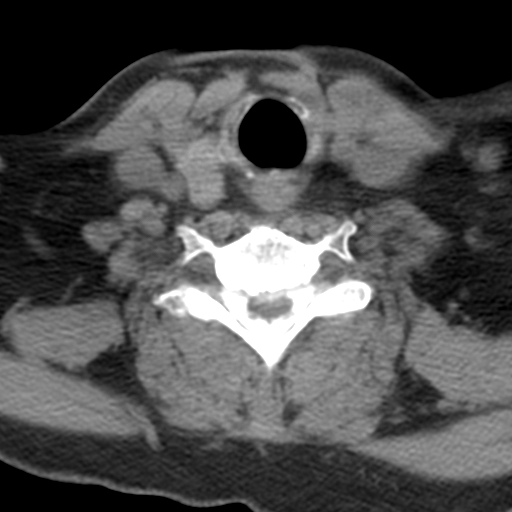
[im 47/94  soft-tissue]
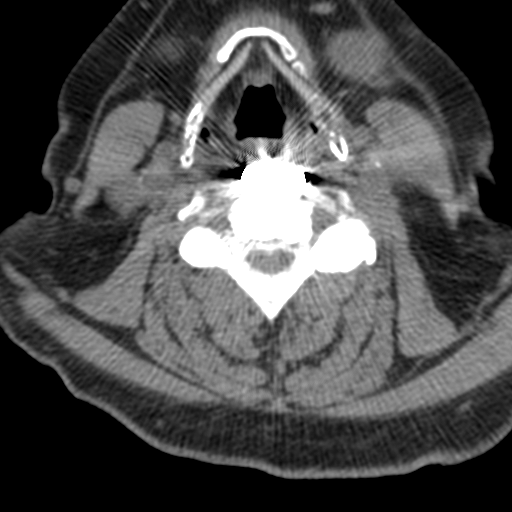
[im 63/94  soft-tissue]
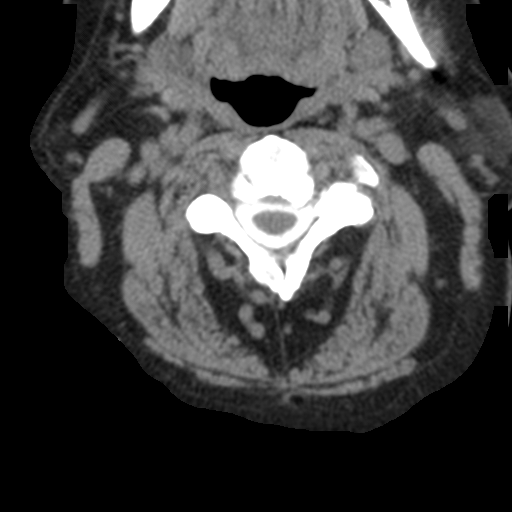
[im 78/94  soft-tissue]
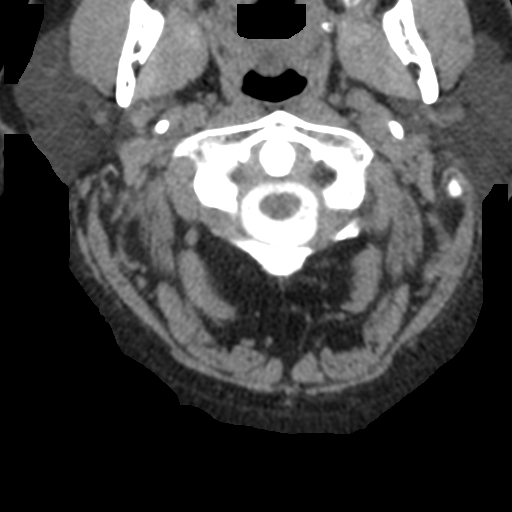

[Series 9: angled axial (person_name) · axial · 0.24mm/px · z∈[-251,-133]mm · 5 of 99 slices shown, 7 images]
[im 17/99  soft-tissue]
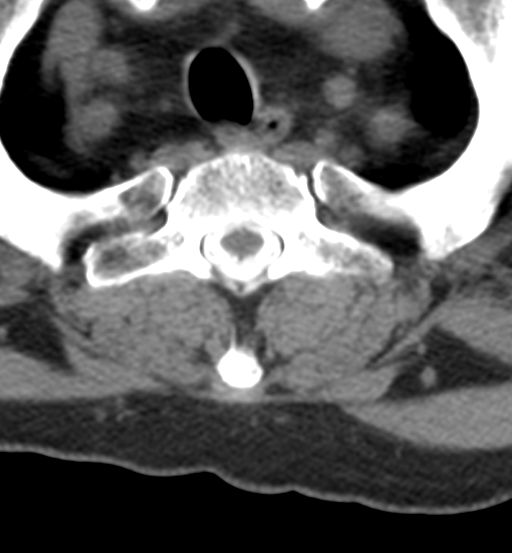
[im 17/99  bone]
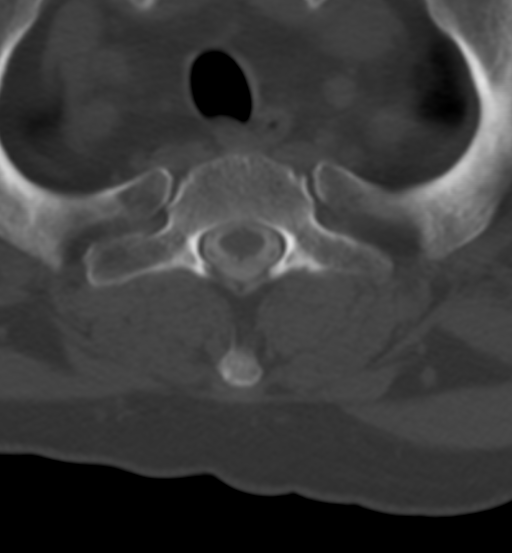
[im 33/99  bone]
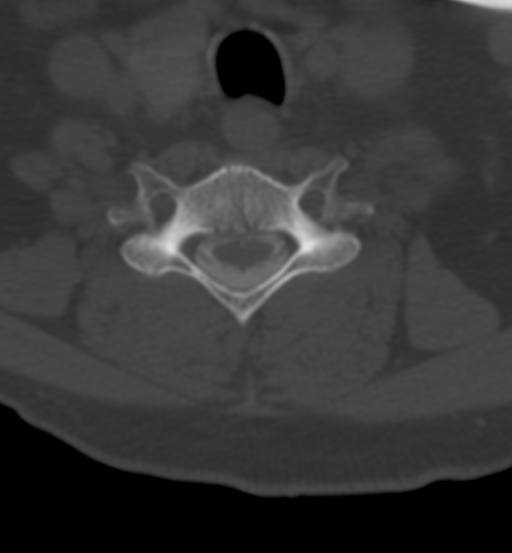
[im 50/99  bone]
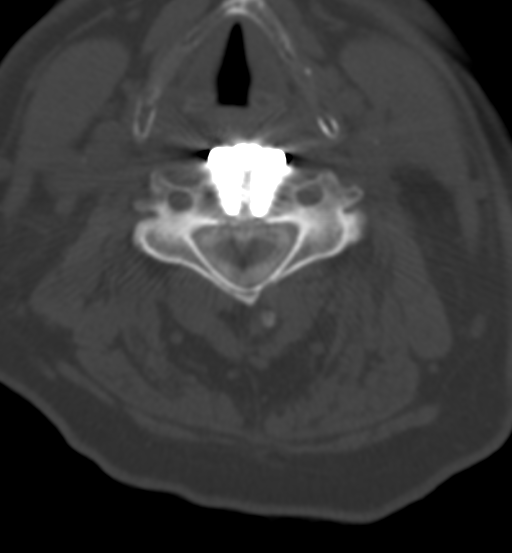
[im 66/99  bone]
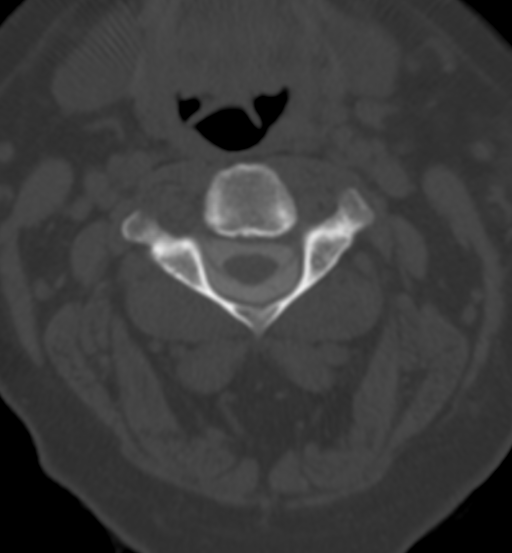
[im 82/99  soft-tissue]
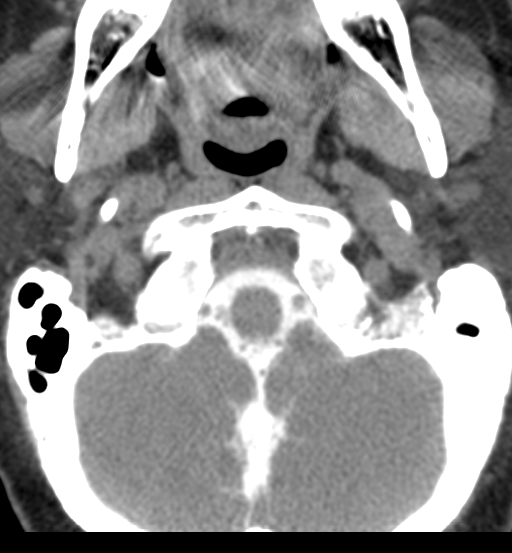
[im 82/99  bone]
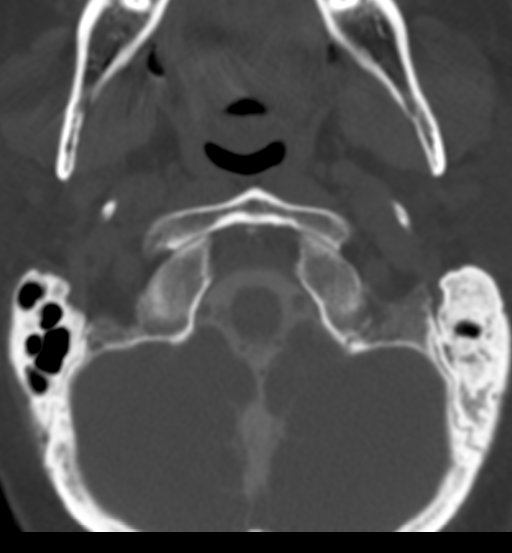

[10 of 14 positions shown; findings below may reference images not displayed]

EXAM:
CERVICAL MYELOGRAM

CT CERVICAL MYELOGRAM

FLUOROSCOPY TIME:  Radiation Exposure Index (as provided by the
fluoroscopic device): 153.3 uGy*m2

PROCEDURE:
LUMBAR PUNCTURE FOR CERVICAL MYELOGRAM

After thorough discussion of risks and benefits of the procedure
including bleeding, infection, injury to nerves, blood vessels,
adjacent structures as well as headache and CSF leak, written and
oral informed consent was obtained. Consent was obtained by Dr.
Imanol Fraga. We discussed the high likelihood of obtaining a
diagnostic study.

Patient was positioned prone on the fluoroscopy table. Local
anesthesia was provided with 1% lidocaine without epinephrine after
prepped and draped in the usual sterile fashion. Puncture was
performed at L2-3 using a 3 1/2 inch 22-gauge spinal needle via left
paramedian approach. Using a single pass through the dura, the
needle was placed within the thecal sac, with return of clear CSF.
10 mL of Isovue [id number] was injected into the thecal sac, with normal
opacification of the nerve roots and cauda equina consistent with
free flow within the subarachnoid space. The patient was then moved
to the trendelenburg position and contrast flowed into the cervical
spine region.

I personally performed the lumbar puncture and administered the
intrathecal contrast. I also personally supervised acquisition of
the myelogram images.
FINDINGS: CERVICAL MYELOGRAM FINDINGS:

Prosthetic disc is noted at the C3-4 level. There is blunting of the
nerve roots on the right at C3-4 and C4-5 nerve roots at C5-6 and
below fill normally on both sides

No significant ventral disc disease is evident. Minimal endplate
changes are noted at C5-6 and C6-7.

CT CERVICAL MYELOGRAM FINDINGS:

Cervical spine is imaged from the skull base through T2-3.
Prosthetic disc is in place at C3-4.

Alignment is anatomic. There straightening of the normal cervical
lordosis.

Craniocervical junction is normal. Soft tissues the neck are
otherwise unremarkable. Patient is status post left thyroidectomy.
Thoracic inlet is normal. Lung apices are clear.

C2-3: Asymmetric left-sided uncovertebral and facet hypertrophy is
again noted. Moderate left foraminal stenosis is present. The right
foramen is patent.

C3-4: Residual uncovertebral spurring is present. Mild foraminal
narrowing is present on the right.

C4-5: Asymmetric left-sided uncovertebral spurring and facet
hypertrophy is present. Mild left foraminal stenosis is noted.

C5-6: Asymmetric right-sided uncovertebral spurring contributes to
mild right foraminal stenosis. There is partial effacement of the
ventral CSF.

C6-7: A rightward disc osteophyte complex partially effaces the
ventral CSF. The foramina are patent.

C7-T1: Negative.
IMPRESSION: 1. Prosthetic disc at C3-4 with decompression of the central canal
but mild residual right foraminal narrowing.
2. Moderate left foraminal stenosis at C2-3.
3. Mild left foraminal stenosis at C4-5.
4. Mild central and right foraminal narrowing at C5-6.
5. Mild right central canal narrowing at C6-7 without significant
foraminal stenosis.
6. No focal lesion to explain right C7 or C8 radiculopathy.

## 2023-07-26 IMAGING — DX DG CHEST 2V
2 series · 2 of 2 positions shown · non-contrast
Comparison: Chest radiograph 06/26/2017

CLINICAL DATA: Palpitations

EXAM:
CHEST - 2 VIEW

[chest pa]
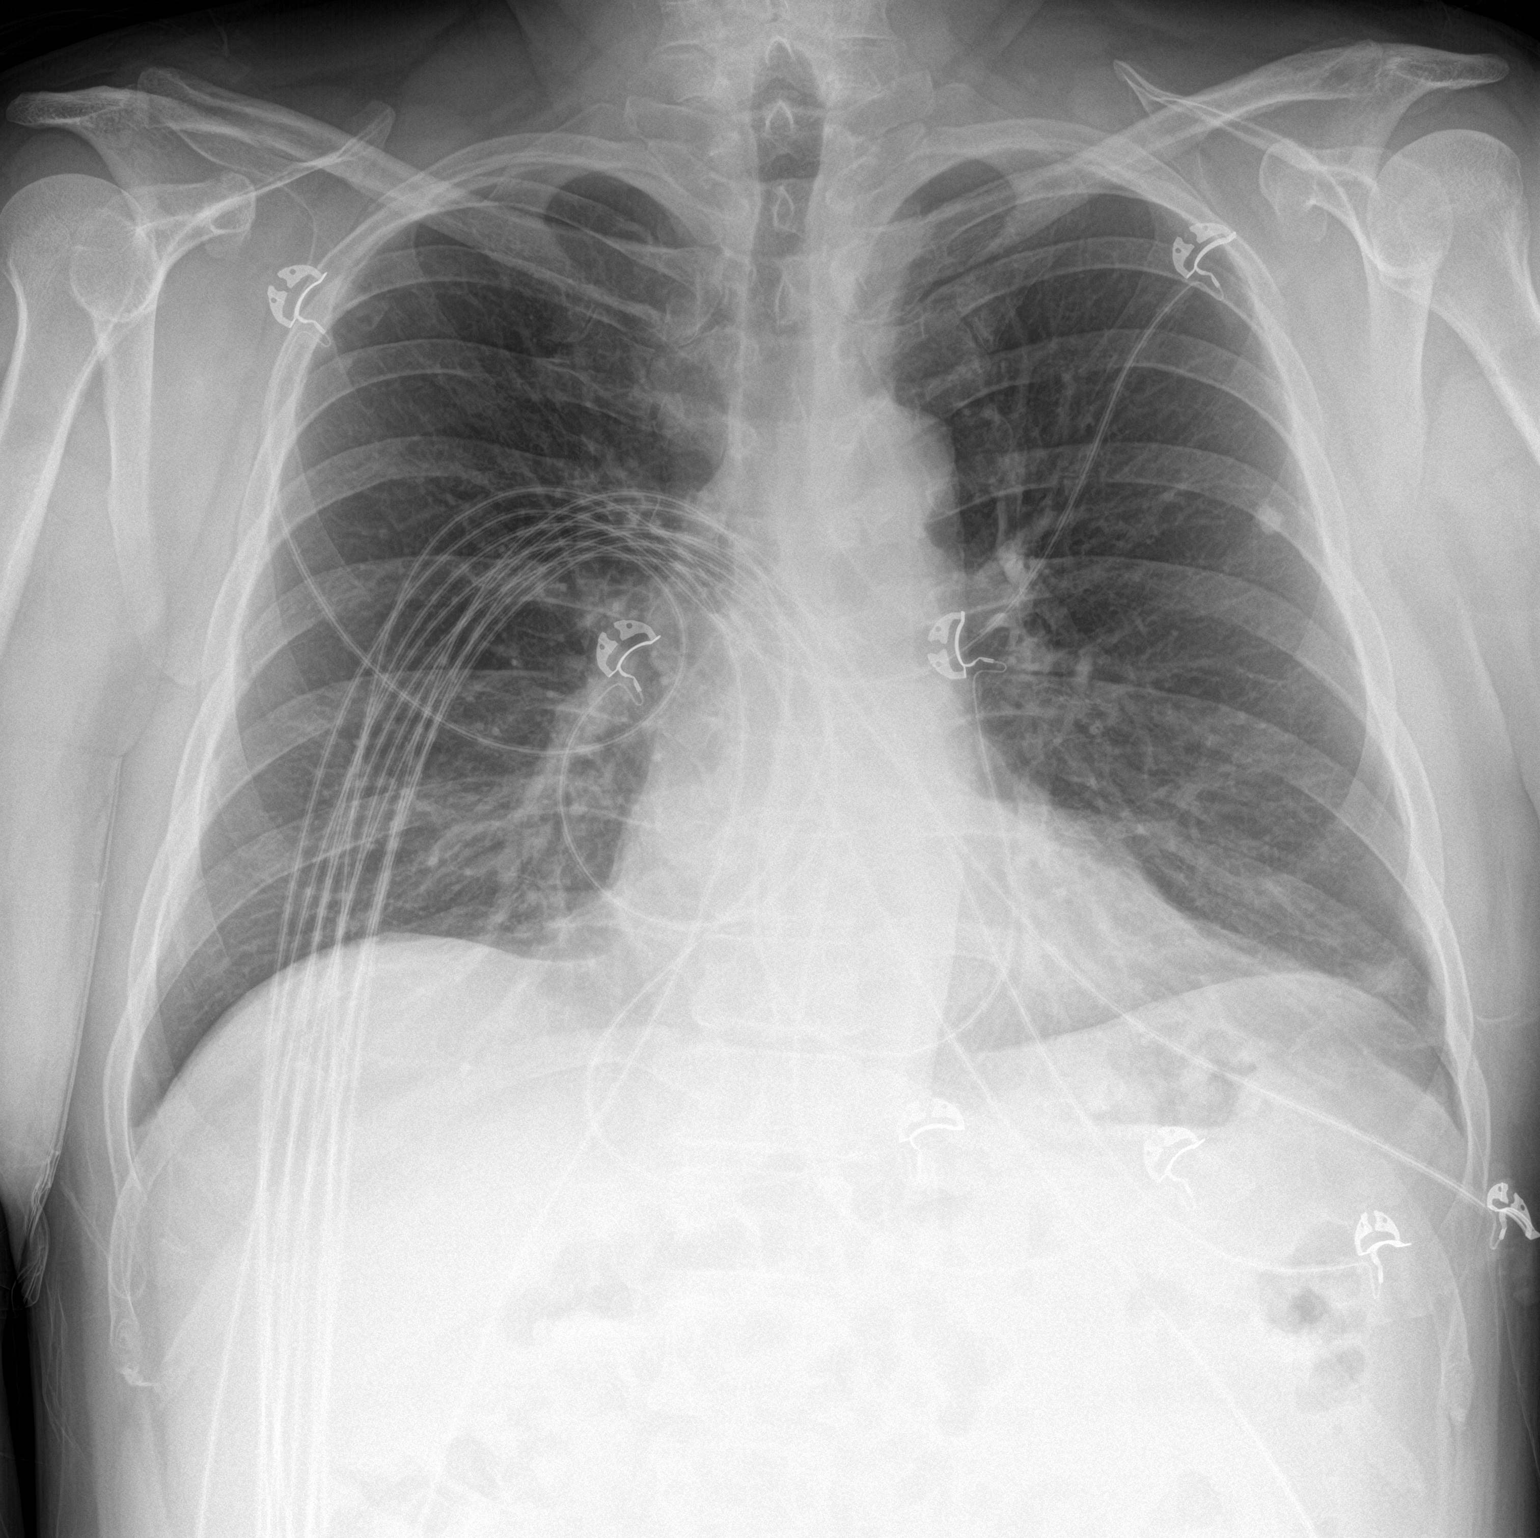

[chest lat]
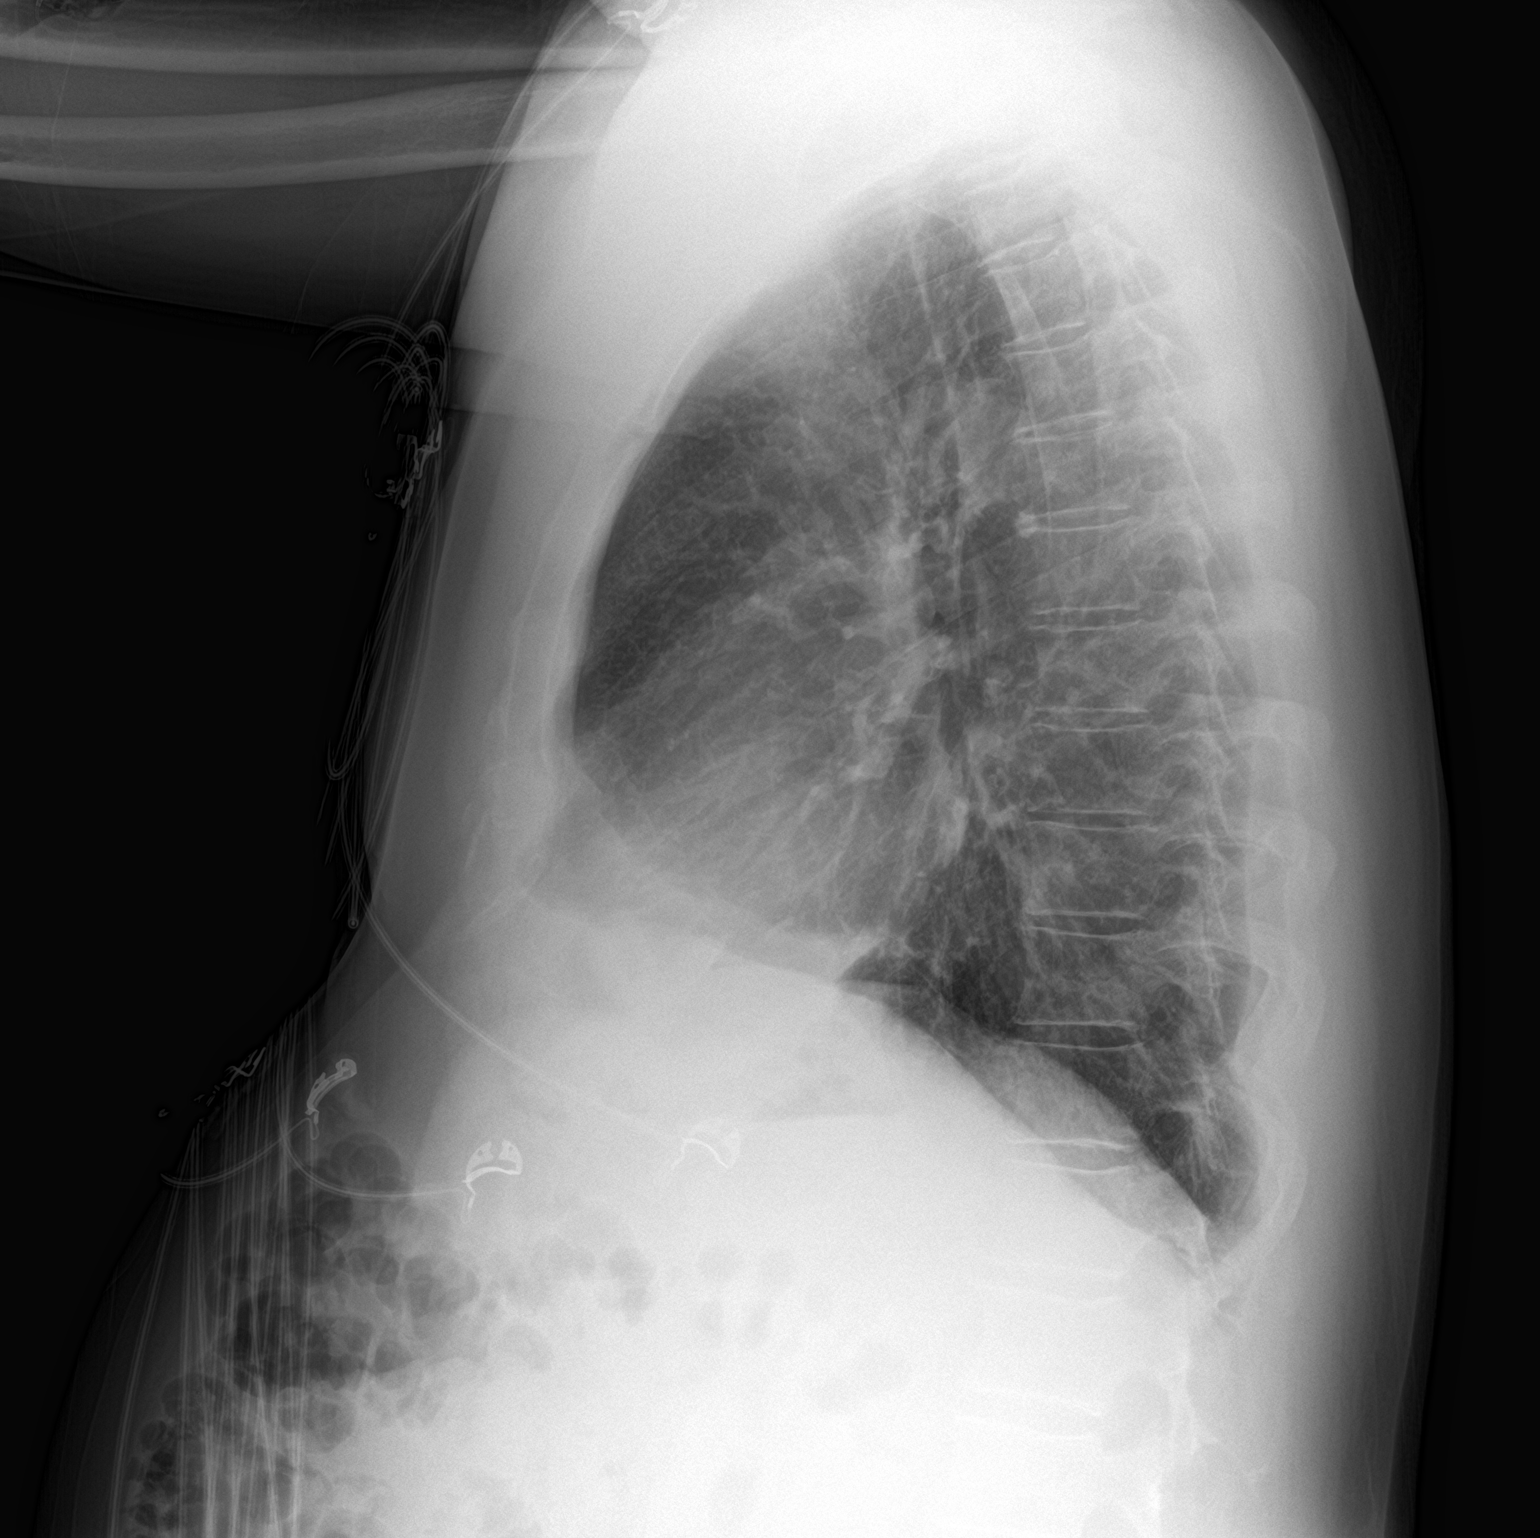

[2 of 2 positions shown; findings below may reference images not displayed]

FINDINGS: Unchanged cardiomediastinal silhouette. There is no focal airspace
consolidation. There is no large pleural effusion or visible
pneumothorax. There is a nodular opacity overlying the left
peripheral mid lung unchanged from prior exam, consistent with a
calcified granuloma. No acute osseous abnormality.
IMPRESSION: No evidence of acute cardiopulmonary disease.

## 2023-12-03 IMAGING — DX DG CHEST 1V PORT
1 series · 1 of 1 positions shown · non-contrast
Comparison: Chest x-ray 11/27/2020

CLINICAL DATA: Chest pain with cough.

EXAM:
PORTABLE CHEST 1 VIEW

[chest ap]
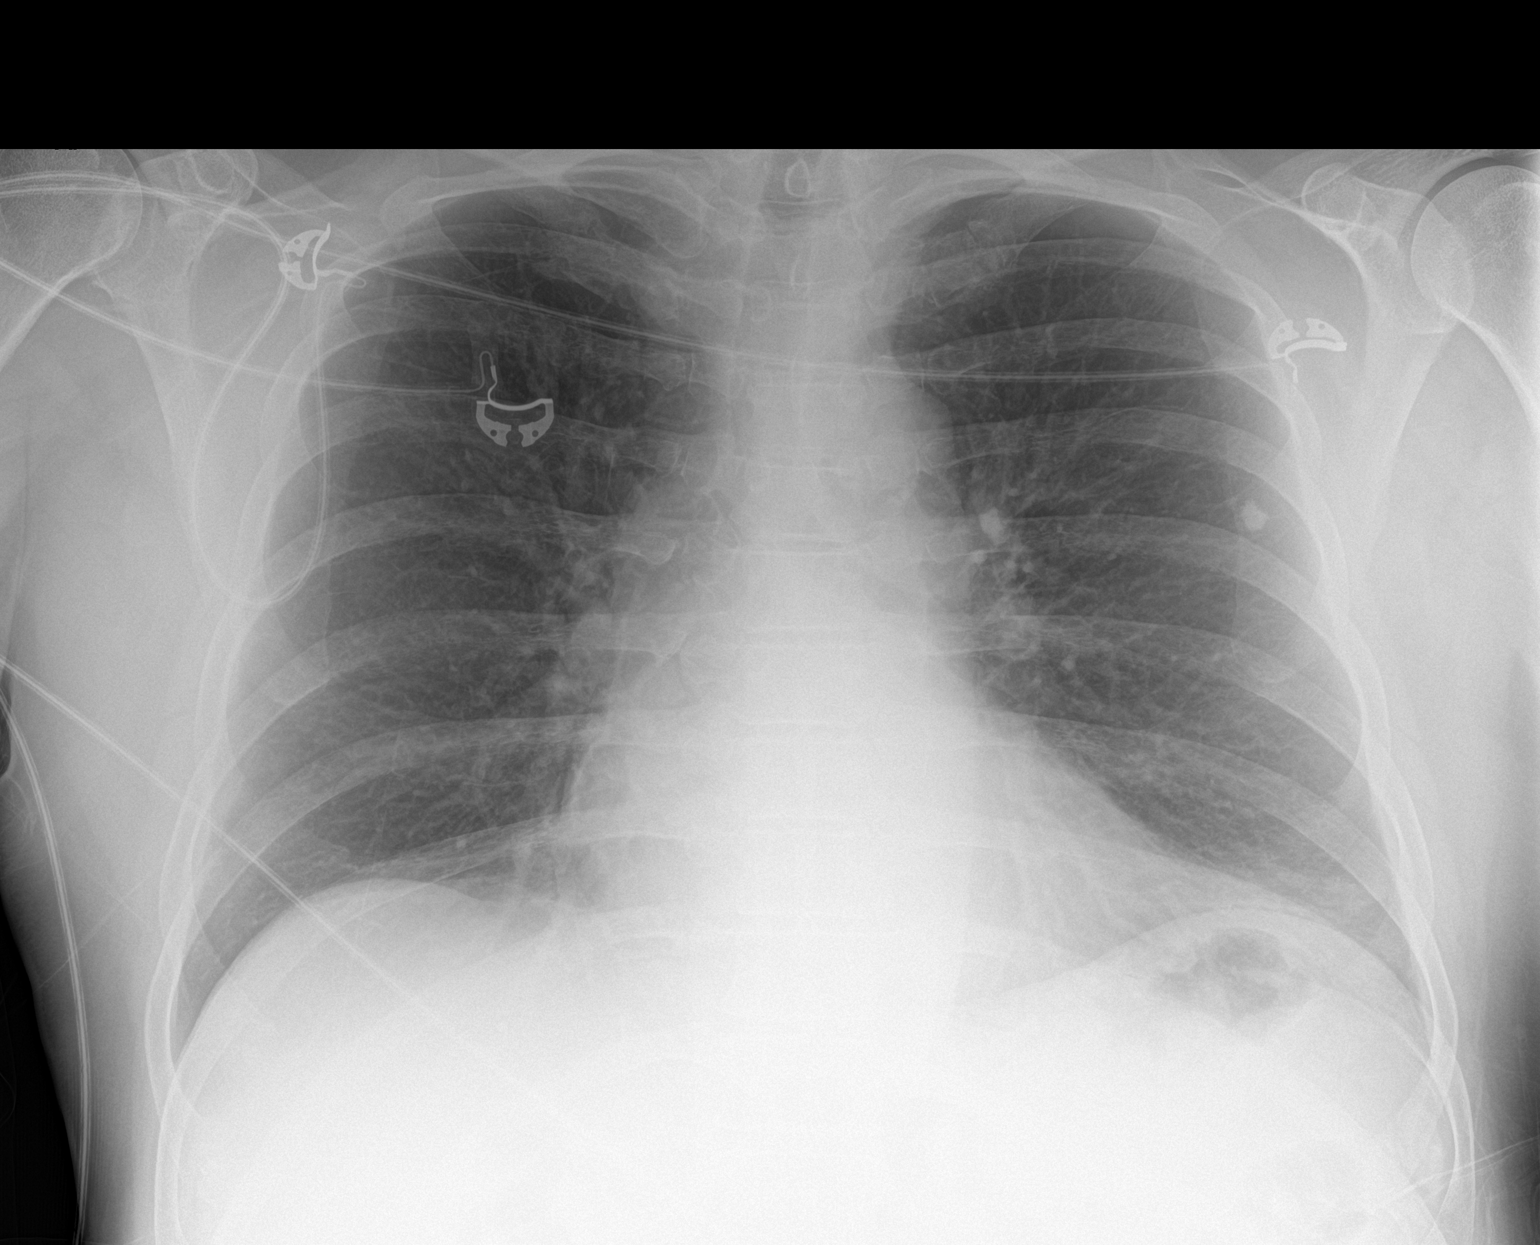

[1 of 1 positions shown; findings below may reference images not displayed]

FINDINGS: The heart size and mediastinal contours are within normal limits.
There is no focal lung infiltrate. There is a stable calcified
granuloma in the left upper lobe. The visualized skeletal structures
are unremarkable.
IMPRESSION: No active disease.
